# Patient Record
Sex: Female | Born: 1975 | Race: White | Hispanic: No | Marital: Married | Smoking: Former smoker
Health system: Southern US, Community
[De-identification: ages and names within clinical notes are randomized; demographics above are authoritative.]

## PROBLEM LIST (undated history)

## (undated) DIAGNOSIS — R51 Headache: Secondary | ICD-10-CM

## (undated) DIAGNOSIS — R519 Headache, unspecified: Secondary | ICD-10-CM

## (undated) DIAGNOSIS — M1990 Unspecified osteoarthritis, unspecified site: Secondary | ICD-10-CM

## (undated) DIAGNOSIS — J45909 Unspecified asthma, uncomplicated: Secondary | ICD-10-CM

## (undated) HISTORY — DX: Headache: R51

## (undated) HISTORY — DX: Headache, unspecified: R51.9

## (undated) HISTORY — PX: OTHER SURGICAL HISTORY: SHX169

## (undated) HISTORY — DX: Unspecified osteoarthritis, unspecified site: M19.90

## (undated) HISTORY — PX: ABLATION: SHX5711

## (undated) HISTORY — DX: Unspecified asthma, uncomplicated: J45.909

---

## 2009-02-20 ENCOUNTER — Ambulatory Visit: Payer: Self-pay | Admitting: Cardiology

## 2012-11-23 DIAGNOSIS — M519 Unspecified thoracic, thoracolumbar and lumbosacral intervertebral disc disorder: Secondary | ICD-10-CM | POA: Insufficient documentation

## 2012-11-23 DIAGNOSIS — M4120 Other idiopathic scoliosis, site unspecified: Secondary | ICD-10-CM | POA: Insufficient documentation

## 2013-01-25 DIAGNOSIS — M461 Sacroiliitis, not elsewhere classified: Secondary | ICD-10-CM | POA: Insufficient documentation

## 2013-02-27 DIAGNOSIS — M545 Low back pain, unspecified: Secondary | ICD-10-CM | POA: Insufficient documentation

## 2014-05-21 ENCOUNTER — Encounter: Payer: Self-pay | Admitting: Neurology

## 2014-05-21 ENCOUNTER — Ambulatory Visit (INDEPENDENT_AMBULATORY_CARE_PROVIDER_SITE_OTHER): Payer: 59 | Admitting: Neurology

## 2014-05-21 VITALS — BP 135/84 | HR 74 | Ht 69.0 in | Wt 342.0 lb

## 2014-05-21 DIAGNOSIS — G4452 New daily persistent headache (NDPH): Secondary | ICD-10-CM | POA: Insufficient documentation

## 2014-05-21 DIAGNOSIS — G932 Benign intracranial hypertension: Secondary | ICD-10-CM

## 2014-05-21 MED ORDER — TOPIRAMATE ER 200 MG PO CAP24: 200.0000 mg | ORAL_CAPSULE | Freq: Every day | ORAL | Status: DC

## 2014-05-21 NOTE — Progress Notes (Signed)
[facility]    Provider:  Dr Lucia GaskinsAhern Referring Provider: Benito MccreedyWilson, Ewain P., MD Primary Care Physician:  Selinda FlavinHOWARD, KEVIN, MD  CC:  Worsening severe headaches  HPI:  Sheri Carrillo is a 39 y.o. female here as a referral from Dr. Andrey CampanileWilson for headaches. 6 months ago the headaches worsened. She feels a lot of pressure in her face. Shooting pains all around the head and in the temple. She sees flashes of light. She was evaluated by ENT and ophthalmology who are worried about IIH. Headaches are daily, some days are worse than others. She has watery eyes and runny nose occ. Feels like a lightning bolt in one area, It happens in the temples and in the forehead. Pressure is in the face and ears, feels exactly like a sinus infection but wkup and CT of the sinuses was negatibe. The headaches are daily, 10/10 pain and can last all day. She has light sensitivity and when it gets bad wants to go and lay down and be still in a dark room. She is miserable, crying, so painful. Eyesight gets blurry, feels like she is straining more than usual. She hears swishing and ringing in the ears. She has gained 60 pounds since 2010. Lots of stress recently as well. No fever or stiff neck, no other focal neurologic deficits. She is here with her husband who also provides information.    Review of Systems: Patient complains of symptoms per HPI as well as the following symptoms: fatigue, blurred vision, eye pain, ringing in ears, spinning sensation, headache, dizziness, runny nose. Pertinent negatives per HPI. All others negative.   History   Social History  . Marital Status: Married    Spouse Name: N/A    Number of Children: 2  . Years of Education: 12    Occupational History  . [employer]  Other   Social History Main Topics  . Smoking status: Former Smoker    Quit date: 04/14/1995  . Smokeless tobacco: Not on file  . Alcohol Use: 0.0 oz/week    0 Not specified per week     Comment:  2-3 per month   . Drug Use: No  . Sexual Activity: Not on file   Other Topics Concern  . Not on file   Social History Narrative   Lives at home with husband.    Has 2 children.    Caffeine use: 2-3 cups per day     Family History  Problem Relation Age of Onset  . Diabetes Mother   . Lung cancer Mother   . Lung cancer Father     History reviewed. No pertinent past medical history.  History reviewed. No pertinent past surgical history.  Current Outpatient Prescriptions  Medication Sig Dispense Refill  . ALPRAZolam (XANAX) 0.5 MG tablet Take 0.5 mg by mouth at bedtime as needed for anxiety or sleep (once a month).    . Ascorbic Acid (VITAMIN C PO) Take 1 tablet by mouth daily.    . Calcium-Magnesium-Zinc (CAL-MAG-ZINC PO) Take 1 tablet by mouth daily.    . cholecalciferol (VITAMIN D) 1000 UNITS tablet Take 2,000 Units by mouth daily.    . cyclobenzaprine (FLEXERIL) 10 MG tablet Take 10 mg by mouth once.    Marland Kitchen. ibuprofen (ADVIL,MOTRIN) 800 MG tablet Take 800 mg by mouth every 8 (eight) hours as needed (once a day).    . Multiple Vitamin (MULTIVITAMIN) capsule Take 1 capsule by mouth daily.    . Topiramate ER 200 MG  CP24 Take 200 mg by mouth at bedtime. 30 capsule 6   No current facility-administered medications for this visit.    Allergies as of 05/21/2014  . (No Known Allergies)    Vitals: BP 135/84 mmHg  Pulse 74  Ht  (1.753 m)  Wt 342 lb (155.13 kg)  BMI 50.48 kg/m2 Last Weight:  Wt Readings from Last 1 Encounters:  05/21/14 342 lb (155.13 kg)   Last Height:   Ht Readings from Last 1 Encounters:  05/21/14  (1.753 m)   Physical exam: Exam: Gen: NAD, conversant, well nourised, obese, well groomed                     CV: RRR, no MRG. No Carotid Bruits. No peripheral edema, warm, nontender Eyes: Conjunctivae clear without exudates or hemorrhage  Neuro: Detailed Neurologic Exam  Speech:    Speech is normal; fluent and spontaneous with normal  comprehension.  Cognition:    The patient is oriented to person, place, and time;     recent and remote memory intact;     language fluent;     normal attention, concentration,     fund of knowledge Cranial Nerves:    The pupils are equal, round, and reactive to light. Bilat 20/30 VA. The fundi are normal and spontaneous venous pulsations are present. Visual fields are full to finger confrontation. Extraocular movements are intact. Trigeminal sensation is intact and the muscles of mastication are normal. The face is symmetric. The palate elevates in the midline. Hearing intact. Voice is normal. Shoulder shrug is normal. The tongue has normal motion without fasciculations.   Coordination:    Normal finger to nose and heel to shin.  Gait:    Heel-toe and tandem gait are normal.   Motor Observation:    No asymmetry, no atrophy, and no involuntary movements noted. Tone:    Normal muscle tone.    Posture:    Posture is normal. normal erect    Strength:    Strength is V/V in the upper and lower limbs.      Sensation: intact to LT     Reflex Exam:  DTR's:    Deep tendon reflexes in the upper and lower extremities are normal bilaterally.   Toes:    The toes are downgoing bilaterally.   Clonus:    Clonus is absent.       Assessment/Plan:  Lovely 39 year old female who is morbidly obese with worsening daily headaches, vision changes, muffled hearing, weight gain. Neuro exam is normal. Will order an MRI of the brain. Will start Trokendi. Will defer LP right now until MRI results, VF testing was normal and there is no papilledema seen (she also saw ophthalmology).   Asked patient to call in about 10 days and let me know how she is doing Denied symptoms of OSA however she may have some obesity hypoventilation affecting her headaches, she declines wkup at this time MRI of the brain  Will start Trokendi Will order CMP.    (Trokendi Samples:  [id number] 07/2016  [id number] rr  07/2014)   Naomie Dean, MD  [facility] [address] [place], [place] [address]  Phone [phone] Fax [phone]

## 2014-05-21 NOTE — Patient Instructions (Signed)
Overall you are doing fairly well but I do want to suggest a few things today:   Remember to drink plenty of fluid, eat healthy meals and do not skip any meals. Try to eat protein with a every meal and eat a healthy snack such as fruit or nuts in between meals. Try to keep a regular sleep-wake schedule and try to exercise daily, particularly in the form of walking, 20-30 minutes a day, if you can.   As far as your medications are concerned, I would like to suggest:  Week one: Trokendi 50mg  at night (2 tabs) Week two: Trokendi 100mg  at night (4 tabs) Week three : Trokendi 200mg  tab at night  As far as diagnostic testing: MRi of the brain, labs  I would like to see you back in 3 months, sooner if we need to. Please call us with any interim questions, concerns, problems, updates or refill requests.   Please also call us for any test results so we can go over those with you on the phone.  My clinical assistant and will answer any of your questions and relay your messages to me and also relay most of my messages to you.   Our phone number is [phone]. We also have an after hours call service for urgent matters and there is a physician on-call for urgent questions. For any emergencies you know to call 911 or go to the nearest emergency room

## 2014-05-22 LAB — COMPREHENSIVE METABOLIC PANEL
ALBUMIN: 4.1 g/dL (ref 3.5–5.5)
ALK PHOS: 71 IU/L (ref 39–117)
ALT: 28 IU/L (ref 0–32)
AST: 20 IU/L (ref 0–40)
Albumin/Globulin Ratio: 1.8 (ref 1.1–2.5)
BUN / CREAT RATIO: 26 — AB (ref 9–23)
BUN: 19 mg/dL (ref 6–24)
Bilirubin Total: 0.3 mg/dL (ref 0.0–1.2)
CHLORIDE: 105 mmol/L (ref 97–108)
CO2: 21 mmol/L (ref 18–29)
Calcium: 9.5 mg/dL (ref 8.7–10.2)
Creatinine, Ser: 0.74 mg/dL (ref 0.57–1.00)
GFR calc Af Amer: 117 mL/min/{1.73_m2} (ref >59)
GFR calc non Af Amer: 102 mL/min/{1.73_m2} (ref >59)
GLUCOSE: 101 mg/dL — AB (ref 65–99)
Globulin, Total: 2.3 g/dL (ref 1.5–4.5)
POTASSIUM: 4.4 mmol/L (ref 3.5–5.2)
Sodium: 141 mmol/L (ref 134–144)
Total Protein: 6.4 g/dL (ref 6.0–8.5)

## 2014-05-24 ENCOUNTER — Telehealth: Payer: Self-pay | Admitting: *Deleted

## 2014-05-24 NOTE — Telephone Encounter (Signed)
Unable to contact patient because the number has been disconnected.

## 2014-05-24 NOTE — Telephone Encounter (Signed)
Cannot get a hold of the patient. Both numbers state "We are sorry this number has been disconnected".

## 2014-05-29 ENCOUNTER — Telehealth: Payer: Self-pay | Admitting: *Deleted

## 2014-05-29 ENCOUNTER — Telehealth: Payer: Self-pay

## 2014-05-29 NOTE — Telephone Encounter (Signed)
Talked with patient about normal lab results. [facility].

## 2014-05-29 NOTE — Telephone Encounter (Signed)
Talked with patient's husband and got correct mobile number. Will try calling the patient's wife on correct mobile number.

## 2014-05-29 NOTE — Telephone Encounter (Signed)
Tried calling patient again. Said "I'm sorry this number has been disconnected, please check the number and try again".

## 2014-05-29 NOTE — Telephone Encounter (Signed)
Left a voicemail for the patient to call [facility].  She just needs to call [phone] option 1. Carollee [facility]terShannon  already sent her orders over.

## 2014-06-01 DIAGNOSIS — G4452 New daily persistent headache (NDPH): Secondary | ICD-10-CM

## 2014-06-04 ENCOUNTER — Other Ambulatory Visit: Payer: Self-pay | Admitting: Neurology

## 2014-06-04 ENCOUNTER — Telehealth: Payer: Self-pay | Admitting: Neurology

## 2014-06-04 DIAGNOSIS — G932 Benign intracranial hypertension: Secondary | ICD-10-CM

## 2014-06-04 MED ORDER — TOPIRAMATE ER 100 MG PO CAP24: 100.0000 mg | ORAL_CAPSULE | Freq: Every day | ORAL | Status: DC

## 2014-06-04 NOTE — Telephone Encounter (Signed)
Spoke to patient, will stay on Trokendi 100mg  qhs for now.   Kara Meadmma - she had her MRi done at [facility]. They should have sent us the disk and possibly the report. Can you go to medical records and see if they have it? If not, we need to call [facility]. Give me an update on Tuesday so I can call this patient back. Thank you.

## 2014-06-04 NOTE — Telephone Encounter (Signed)
Patient is calling about her MRI results.  Please call

## 2014-06-04 NOTE — Telephone Encounter (Signed)
Spoke to patient, will stay on Trokendi 100mg qhs for now.   Emma - she had her MRi done at [facility]. They should have sent us the disk and possibly the report. Can you go to medical records and see if they have it? If not, we need to call [facility]. Give me an update on Tuesday so I can call this patient back. Thank you.  

## 2014-06-05 NOTE — Telephone Encounter (Signed)
I did call her and let he know the MRI was normal but possibly showed some acute sinusitis. It was a little late so I said I would call back in the morning but I did want her to go to bed tonight knowing the mri of her brain looked good. This does not rule out Increased Intracranial pressure but we can discuss that in the morning. Thanks, Sheralyn Boatmanoni

## 2014-06-07 ENCOUNTER — Other Ambulatory Visit: Payer: Self-pay | Admitting: Neurology

## 2014-06-07 ENCOUNTER — Telehealth: Payer: Self-pay | Admitting: Neurology

## 2014-06-07 ENCOUNTER — Telehealth: Payer: Self-pay | Admitting: *Deleted

## 2014-06-07 DIAGNOSIS — G932 Benign intracranial hypertension: Secondary | ICD-10-CM

## 2014-06-07 MED ORDER — TOPIRAMATE 25 MG PO TABS: 25.0000 mg | ORAL_TABLET | Freq: Two times a day (BID) | ORAL | Status: DC

## 2014-06-07 MED ORDER — AMOXICILLIN-POT CLAVULANATE 875-125 MG PO TABS: 1.0000 | ORAL_TABLET | Freq: Two times a day (BID) | ORAL | Status: DC

## 2014-06-07 NOTE — Telephone Encounter (Signed)
Left [facility] message for [facility] to see if the patient could get a LP completed either tomorrow or Monday. Gave [facility] phone and office hours.

## 2014-06-07 NOTE — Telephone Encounter (Signed)
Pt is calling stating she wanted a call back regarding Topiramate ER 100 MG CP24. She states that the insurance will not cover.  She is completely out.  Please call and advise.

## 2014-06-07 NOTE — Telephone Encounter (Signed)
Patient's ins will not cover Trokendi XR (excluded from plan), but will cover regular Topiramate.  Would you like to change Rx?  Please advise.  Thank you.

## 2014-06-07 NOTE — Telephone Encounter (Signed)
Sheri Carrillo, patient needs an LP. Think we can leave Duwayne HeckDanielle a message? I am placing the order. Not sure it can happen tomorrow or Monday. thanks

## 2014-06-08 ENCOUNTER — Telehealth: Payer: Self-pay | Admitting: *Deleted

## 2014-06-08 NOTE — Telephone Encounter (Signed)
Talked with Duwayne [facility]le [facility] and she said she already talked with the patient and scheduled an appointment for the LP on 06/11/14 at 10:00am.

## 2014-06-11 ENCOUNTER — Inpatient Hospital Stay
Admission: RE | Admit: 2014-06-11 | Discharge: 2014-06-11 | Disposition: A | Discharge status: Home / Self Care | Payer: Self-pay | Source: Ambulatory Visit | Attending: Neurology | Admitting: Neurology

## 2014-06-11 NOTE — Discharge Instructions (Signed)

## 2014-06-13 NOTE — Telephone Encounter (Signed)
Called patient to see how her headache was and if she had scheduled the LP with opening pressure. Left message on voice mail to cal us back if needed.   Sheri Carrillo - will you please follow up with Duwayne Heckanielle at [facility] to see what happened with the lumbar puncture, if she was able to reach patient and schedule? Thank you

## 2014-06-14 ENCOUNTER — Telehealth: Payer: Self-pay | Admitting: *Deleted

## 2014-06-14 NOTE — Telephone Encounter (Signed)
Talked with patient to see if she had her lumbar puncture on 06/11/14. Patient stated " I chickened out and never went and I was going to call Dr. Lucia GaskinsAhern about it". She said she would like to talk with Dr. Lucia GaskinsAhern about it.

## 2014-06-14 NOTE — Telephone Encounter (Signed)
I didn't see any results. Can you see if she had it done?

## 2014-06-17 ENCOUNTER — Other Ambulatory Visit: Payer: Self-pay | Admitting: Neurology

## 2014-06-17 MED ORDER — TOPIRAMATE 25 MG PO TABS: ORAL_TABLET | ORAL | Status: DC

## 2014-06-17 NOTE — Telephone Encounter (Signed)
Spoke to patient. Her headaches are improving. Will increase Topamax to 50mg  in the evenings and 25mg  in the morning. If patient tolerates can try 50mg  bid. Otherwise can add lasix. She has a lot of personal issues currently but when they have subsided will arrange a sleep study and LP. In the meantime continue the topamax as drected.

## 2015-12-03 DIAGNOSIS — R5381 Other malaise: Secondary | ICD-10-CM | POA: Insufficient documentation

## 2016-09-15 DIAGNOSIS — M25569 Pain in unspecified knee: Secondary | ICD-10-CM | POA: Insufficient documentation

## 2016-09-15 DIAGNOSIS — IMO0002 Reserved for concepts with insufficient information to code with codable children: Secondary | ICD-10-CM | POA: Insufficient documentation

## 2016-12-25 ENCOUNTER — Encounter: Payer: Self-pay | Admitting: *Deleted

## 2016-12-28 ENCOUNTER — Ambulatory Visit (INDEPENDENT_AMBULATORY_CARE_PROVIDER_SITE_OTHER): Payer: Self-pay | Admitting: Cardiovascular Disease

## 2016-12-28 ENCOUNTER — Encounter: Payer: Self-pay | Admitting: Cardiovascular Disease

## 2016-12-28 VITALS — BP 118/75 | HR 67 | Ht 69.0 in | Wt 331.0 lb

## 2016-12-28 DIAGNOSIS — R0609 Other forms of dyspnea: Secondary | ICD-10-CM

## 2016-12-28 DIAGNOSIS — R002 Palpitations: Secondary | ICD-10-CM

## 2016-12-28 DIAGNOSIS — R0789 Other chest pain: Secondary | ICD-10-CM

## 2016-12-28 DIAGNOSIS — Z9289 Personal history of other medical treatment: Secondary | ICD-10-CM

## 2016-12-28 NOTE — Patient Instructions (Signed)
Medication Instructions:  Continue all current medications.  Labwork: none  Testing/Procedures: none  Follow-Up: 3 months   Any Other Special Instructions Will Be Listed Below (If Applicable).  If you need a refill on your cardiac medications before your next appointment, please call your pharmacy.  

## 2016-12-28 NOTE — Progress Notes (Signed)
CARDIOLOGY CONSULT NOTE  Patient ID: Sheri Carrillo MRN: [id number] DOB/AGE: 04/20/1975 41 y.o.  Admit date: (Not on file) Primary Physician: Selinda Flavin, MD Referring Physician: Ila Mcgill PA  Reason for Consultation: Chest tightness and exertional dyspnea  HPI: Sheri Carrillo is a 41 y.o. female who is being seen today for the evaluation of chest tightness and exertional dyspnea at the request of Allwardt, Alyssa, PA.   She was evaluated in the ED for shortness of breath on 11/23/16 at [facility] I personally reviewed all relevant documentation, labs, and studies. She has a history of tobacco abuse and both parents passed way of lung cancer.  In the ED she was hypertensive, blood pressure 154/89. Oxygen saturations were normal. X-ray showed no acute cardiopulmonary abnormality. This was a portable film.  Labs: Sodium 136, potassium 3.8, BUN 17, creatinine 0.79, calcium 9.1, magnesium 2.1, normal liver transaminases, normal troponin, normal N-terminal proBNP of 62, normal d-dimer, white blood cells 8.9, hemoglobin 13.2, platelets 381, normal urinalysis.  She underwent CT angiography of the chest which showed no evidence of pulmonary embolus. The visualized great vessels appear unremarkable. There was no adenopathy and no thoracic aortic lesion.  ECG which are percent interpreted demonstrated normal sinus rhythm with no ischemic ST segment or T-wave abnormalities, nor any arrhythmias.  She told me symptoms initially began during a beach trip with her husband. She was developing exertional dyspnea and then went to an urgent care. She received a breathing treatment and then felt better.  She also describes palpitations after starting Topamax. She is still on Topamax but started Celexa and now feels better. She does have a long history of palpitations.  Over the past 2 months, she has had some exertional dyspnea and upper sided chest tightness on  the right chest and left infra-axillary region. This has occurred on at least one or two occasions.  When showering one day, she felt fatigued and almost felt like she was going to pass out.  She said she is very sedentary and sits all day in front of a computer.   She and her husband used to eat out 90% of the time but she recently changed her diet in the past few weeks and now has felt more energetic.  She has been walking a trail in [place] with her daughter which is 3 miles long. The first time she was able to walk 3 miles without difficulty. The second time she walked one and a half miles and had some shortness of breath when walking uphill and had to stop.  She tells me that her lipids and blood glucose levels are routinely normal. She did have vitamin D and vitamin B-12 deficiency for a time.  She denies orthopnea and paroxysmal nocturnal dyspnea. She used to have bilateral feet and ankle swelling but since changing her diet, this now only occurs during the time of menses.   No Known Allergies  Current Outpatient Prescriptions  Medication Sig Dispense Refill  . ALPRAZolam (XANAX) 0.5 MG tablet Take 0.5 mg by mouth at bedtime as needed for anxiety or sleep.     . Calcium-Magnesium-Zinc (CAL-MAG-ZINC PO) Take 1 tablet by mouth daily.    . citalopram (CELEXA) 10 MG tablet Take 10 mg by mouth daily.    Marland Kitchen ibuprofen (ADVIL,MOTRIN) 800 MG tablet Take 800 mg by mouth every 8 (eight) hours as needed.     . topiramate (TOPAMAX) 100 MG tablet Take 100 mg by mouth  2 (two) times daily.     No current facility-administered medications for this visit.     Past Medical History:  Diagnosis Date  . Headache     Past Surgical History:  Procedure Laterality Date  . no surgical history      Social History   Social History  . Marital status: Married    Spouse name: N/A  . Number of children: 2  . Years of education: 12    Occupational History  . [employer]  Other   Social History  Main Topics  . Smoking status: Former Smoker    Quit date: 04/14/1995  . Smokeless tobacco: Never Used  . Alcohol use 0.0 oz/week     Comment: 2-3 per month   . Drug use: No  . Sexual activity: Not on file   Other Topics Concern  . Not on file   Social History Narrative   Lives at home with husband.    Has 2 children.    Caffeine use: 2-3 cups per day      No family history of premature CAD in 1st degree relatives.  Current Meds  Medication Sig  . ALPRAZolam (XANAX) 0.5 MG tablet Take 0.5 mg by mouth at bedtime as needed for anxiety or sleep.   . Calcium-Magnesium-Zinc (CAL-MAG-ZINC PO) Take 1 tablet by mouth daily.  . citalopram (CELEXA) 10 MG tablet Take 10 mg by mouth daily.  Marland Kitchen ibuprofen (ADVIL,MOTRIN) 800 MG tablet Take 800 mg by mouth every 8 (eight) hours as needed.   . topiramate (TOPAMAX) 100 MG tablet Take 100 mg by mouth 2 (two) times daily.      Review of systems complete and found to be negative unless listed above in HPI    Physical exam Blood pressure 117/79, pulse 72, height  (1.753 m), weight (!) 331 lb (150.1 kg), SpO2 97 %. General: NAD Neck: No JVD, no thyromegaly or thyroid nodule.  Lungs: Clear to auscultation bilaterally with normal respiratory effort. CV: Nondisplaced PMI. Regular rate and rhythm, normal S1/S2, no S3/S4, no murmur.  No peripheral edema.  No carotid bruit.    Abdomen: Soft, nontender, no distention.  Skin: Intact without lesions or rashes.  Neurologic: Alert and oriented x 3.  Psych: Normal affect. Extremities: No clubbing or cyanosis.  HEENT: Normal.   ECG: Most recent ECG reviewed.   Labs: Lab Results  Component Value Date/Time   K 4.4 05/21/2014 03:16 PM   BUN 19 05/21/2014 03:16 PM   CREATININE 0.74 05/21/2014 03:16 PM   ALT 28 05/21/2014 03:16 PM     Lipids: No results found for: LDLCALC, LDLDIRECT, CHOL, TRIG, HDL      ASSESSMENT AND PLAN:  1. Chest pain and exertional dyspnea: She has a low pretest  probability of coronary artery disease. ECG was normal as were troponins and BNP. "Heart enlargement "by portable chest x-rays is nonspecific and often occurs due to radiographic splaying. No coronary artery calcifications were seen with chest CT. I recommend continued exercise and dietary modification with weight loss. She may have some restrictive lung disease due to morbid obesity. This is reversible with weight loss.  2. Palpitations: These occur less frequently since being on Celexa. They may have initially been triggered or exacerbated by Topamax. She does not require Holter or event monitoring at this time.     Disposition: Follow up in 3 months   Signed: Prentice Docker, M.D., F.A.C.C.  12/28/2016, 9:27 AM

## 2017-03-30 ENCOUNTER — Ambulatory Visit: Payer: Self-pay | Admitting: Cardiovascular Disease

## 2017-03-30 ENCOUNTER — Encounter: Payer: Self-pay | Admitting: Cardiovascular Disease

## 2017-03-31 ENCOUNTER — Encounter: Payer: Self-pay | Admitting: Cardiovascular Disease

## 2017-09-21 ENCOUNTER — Other Ambulatory Visit (HOSPITAL_COMMUNITY)
Admission: RE | Admit: 2017-09-21 | Discharge: 2017-09-21 | Disposition: A | Discharge status: Home / Self Care | Payer: Self-pay | Source: Ambulatory Visit | Attending: Nurse Practitioner | Admitting: Nurse Practitioner

## 2017-09-21 DIAGNOSIS — N841 Polyp of cervix uteri: Secondary | ICD-10-CM | POA: Insufficient documentation

## 2020-07-25 ENCOUNTER — Other Ambulatory Visit: Payer: Self-pay | Admitting: Orthopedic Surgery

## 2020-07-25 DIAGNOSIS — M2341 Loose body in knee, right knee: Secondary | ICD-10-CM

## 2020-07-25 DIAGNOSIS — M25561 Pain in right knee: Secondary | ICD-10-CM

## 2020-07-25 DIAGNOSIS — M23204 Derangement of unspecified medial meniscus due to old tear or injury, left knee: Secondary | ICD-10-CM

## 2020-07-25 DIAGNOSIS — M25562 Pain in left knee: Secondary | ICD-10-CM

## 2021-01-12 ENCOUNTER — Ambulatory Visit
Admission: RE | Admit: 2021-01-12 | Discharge: 2021-01-12 | Disposition: A | Discharge status: Home / Self Care | Payer: BC Managed Care – PPO | Source: Ambulatory Visit | Attending: Orthopedic Surgery | Admitting: Orthopedic Surgery

## 2021-01-12 ENCOUNTER — Other Ambulatory Visit: Payer: Self-pay

## 2021-01-12 DIAGNOSIS — M25562 Pain in left knee: Secondary | ICD-10-CM

## 2021-01-12 DIAGNOSIS — M2341 Loose body in knee, right knee: Secondary | ICD-10-CM

## 2021-01-12 DIAGNOSIS — M23204 Derangement of unspecified medial meniscus due to old tear or injury, left knee: Secondary | ICD-10-CM

## 2021-01-12 DIAGNOSIS — M25561 Pain in right knee: Secondary | ICD-10-CM

## 2021-01-12 IMAGING — MR MR KNEE*R* W/O CM
6 series · 40 of 40 positions shown · non-contrast
Comparison: None.

CLINICAL DATA: Right knee pain

EXAM:
MRI OF THE RIGHT KNEE WITHOUT CONTRAST
TECHNIQUE: Multiplanar, multisequence MR imaging of the knee was performed. No
intravenous contrast was administered.

[Series 4: T2 fat-sat · axial · right · 4.0mm · 0.62mm/px · z∈[-80,+54]mm · 6 of 32 slices shown (1 of 3)]
[im 1/32]
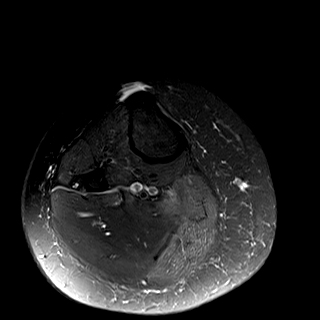
[im 7/32]
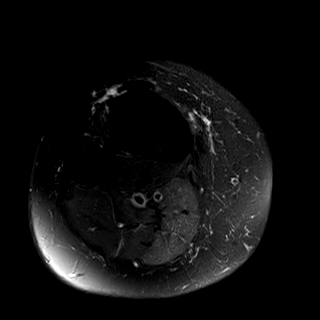
[im 13/32]
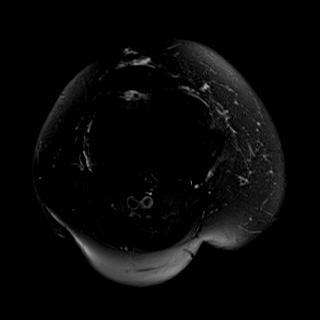
[im 19/32]
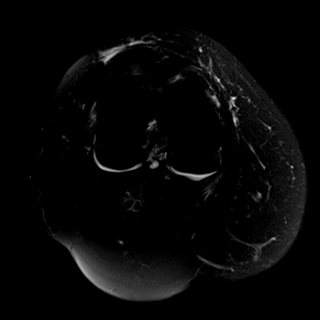
[im 25/32]
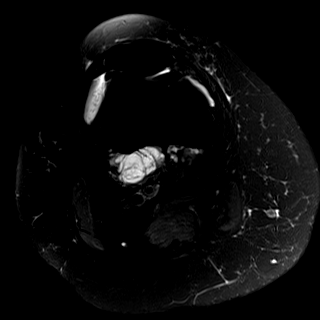
[im 32/32]
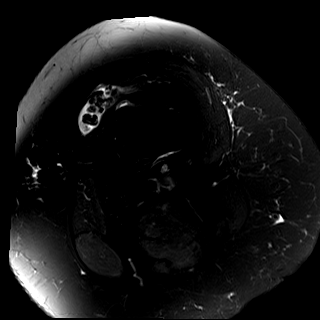

[Series 5: T2 fat-sat · coronal · right · 4.0mm · 0.62mm/px · 7 of 32 slices shown (2 of 3)]
[im 1/32]
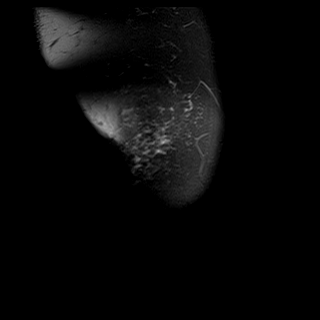
[im 6/32]
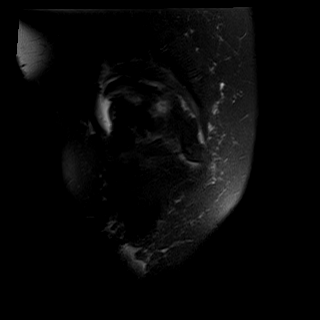
[im 11/32]
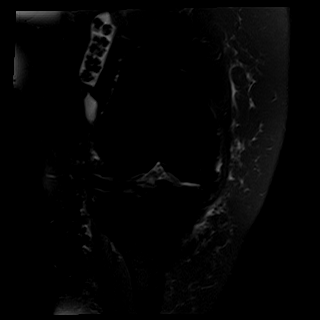
[im 16/32]
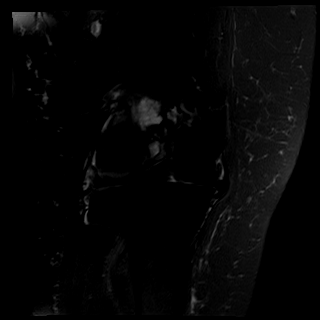
[im 21/32]
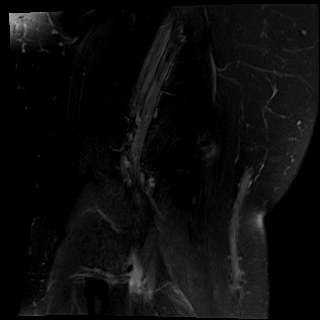
[im 26/32]
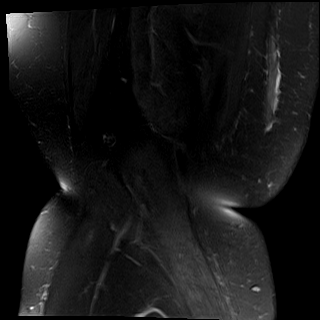
[im 32/32]
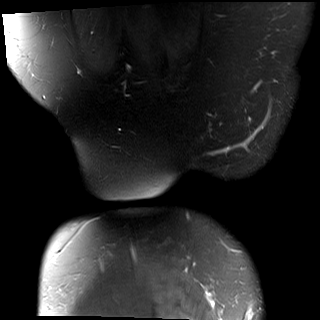

[Series 6: T1 · coronal · right · 4.0mm · 0.62mm/px · 7 of 32 slices shown]
[im 1/32]
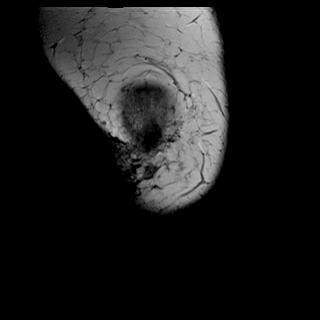
[im 6/32]
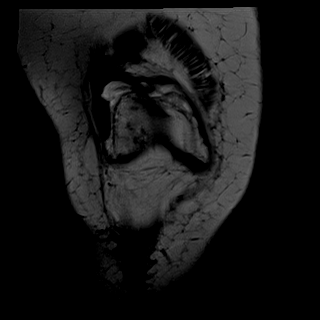
[im 11/32]
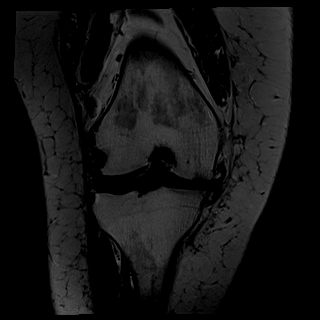
[im 16/32]
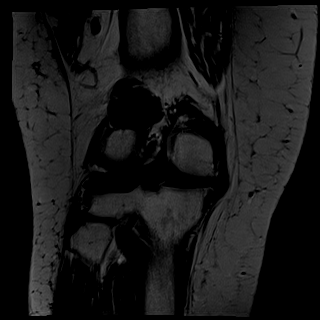
[im 21/32]
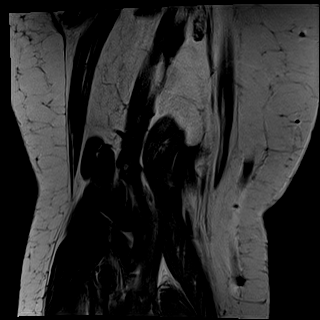
[im 26/32]
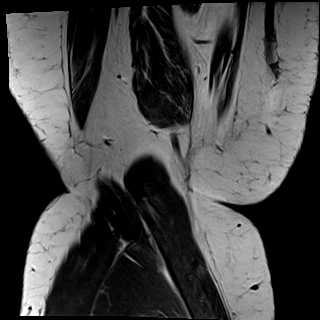
[im 32/32]
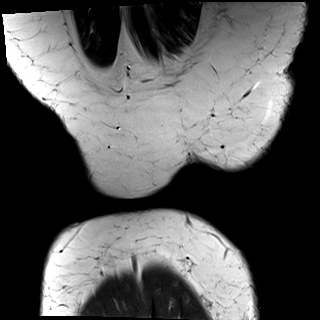

[Series 7: PD fat-sat · coronal · right · 3.0mm · 0.78mm/px · 8 of 37 slices shown (1 of 2)]
[im 1/37]
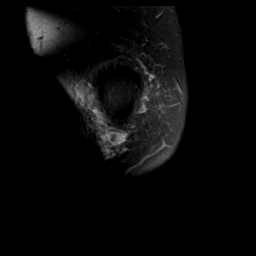
[im 6/37]
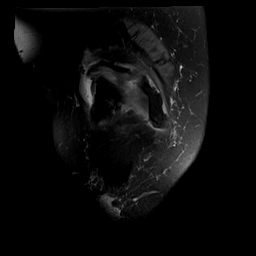
[im 11/37]
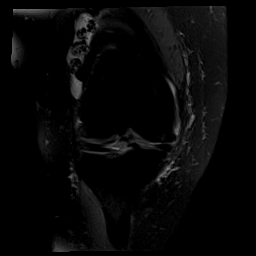
[im 16/37]
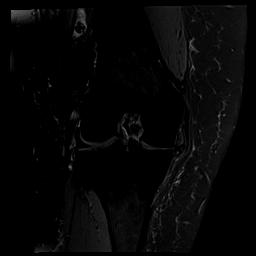
[im 21/37]
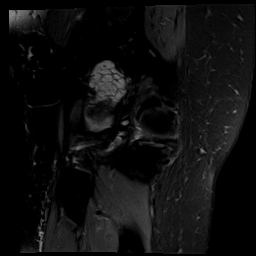
[im 26/37]
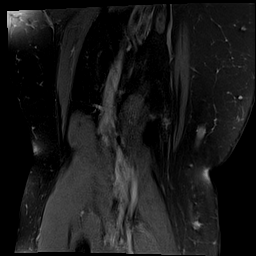
[im 31/37]
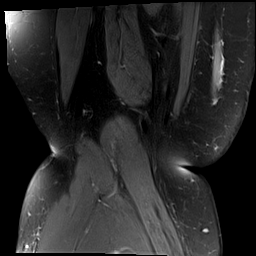
[im 37/37]
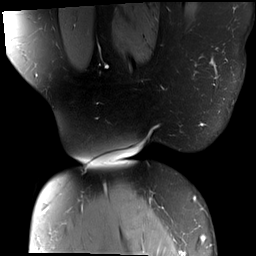

[Series 8: PD fat-sat · sagittal · right · 3.3mm · 0.62mm/px · 6 of 28 slices shown (2 of 2)]
[im 1/28]
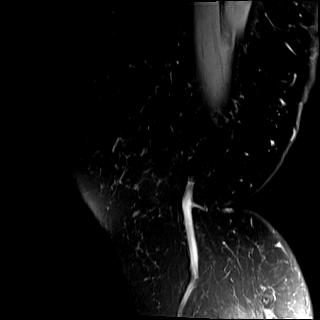
[im 6/28]
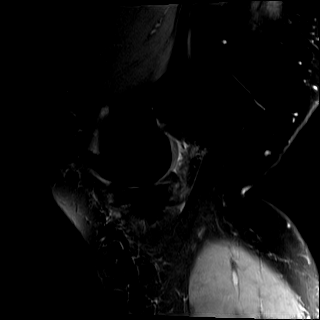
[im 11/28]
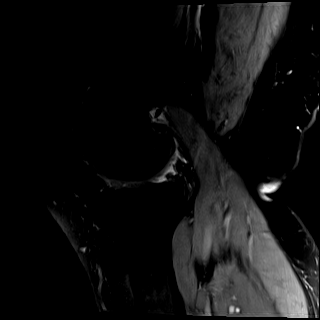
[im 17/28]
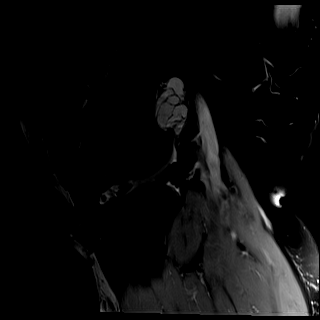
[im 22/28]
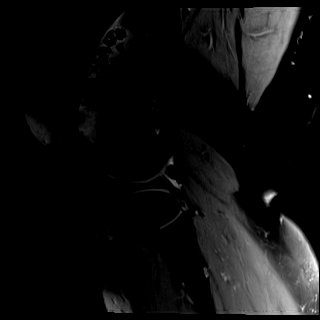
[im 28/28]
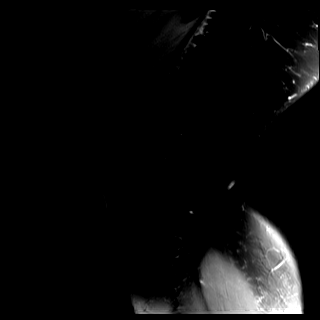

[Series 9: T2 fat-sat · sagittal · right · 3.3mm · 0.62mm/px · 6 of 28 slices shown (3 of 3)]
[im 1/28]
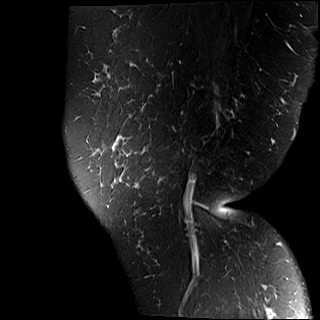
[im 6/28]
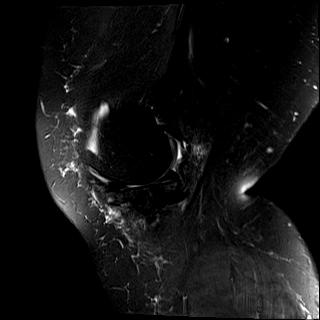
[im 11/28]
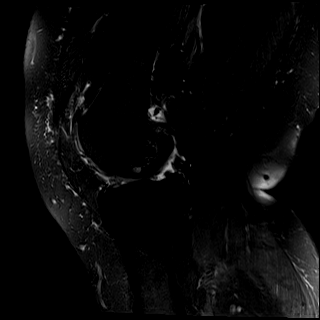
[im 17/28]
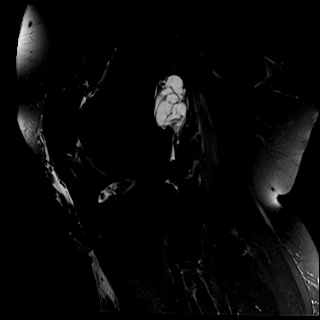
[im 22/28]
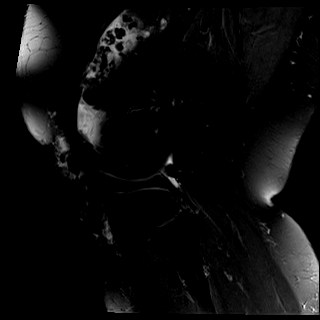
[im 28/28]
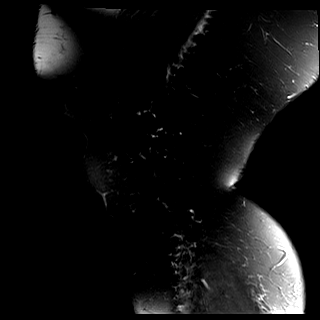

[40 of 40 positions shown; findings below may reference images not displayed]

FINDINGS: MENISCI

Medial: Degenerative tearing of the posterior horn and body of the
medial meniscus, with 6 mm medial extrusion and likely radial tear
in the posterior horn (coronal T2 image 15) and substance loss near
the meniscal root.

Lateral: Intrasubstance degenerative signal without definitive
lateral meniscus tear.

LIGAMENTS

Cruciates: ACL and PCL are intact.

Collaterals: Bowing of the MCL from extruded meniscus. The MCL is
intact. Lateral collateral ligament complex is intact.

CARTILAGE

Patellofemoral: Near full-thickness cartilage loss along the lateral
patellar facet and lateral trochlea with some subchondral bony
edema.

Medial: Severe chondrosis with near full-thickness cartilage loss
along the weight-bearing surfaces.

Lateral:  Mild chondrosis.

JOINT: Small joint effusion. There are multiple intra-articular
osteochondral joint bodies along the superolateral suprapatellar
joint space (axial T2 image 1).

POPLITEAL FOSSA: No Baker's cyst.

EXTENSOR MECHANISM: Intact quadriceps tendon. Intact patellar
tendon.

BONES: No aggressive osseous lesion. No fracture or dislocation.
Tricompartment osteophyte formation.

Other: Ganglion cyst formation along the posterosuperior lateral
joint capsule and lateral gastrocnemius tendon measuring 3.5 x
by 3.3 cm (axial T2 image 7, sagittal image 18). Smaller ganglion
cyst formation along the medial gastrocnemius insertion and along
the posterior aspect of the ACL and PCL.
IMPRESSION: Tricompartment osteoarthritis of the right knee, worst in the medial
and patellofemoral compartments, cartilaginous abnormalities as
described above. Multiple intra-articular osteochondral joint bodies
along the superolateral joint.

Degenerative tearing of the posterior horn and body of the medial
meniscus with 6 mm medial extrusion, with likely radial tear in the
posterior horn and substance loss near the meniscal root.

Prominent multifocal ganglion cyst formation as described above.

## 2021-01-12 IMAGING — MR MR KNEE*L* W/O CM
6 series · 40 of 40 positions shown · non-contrast
Comparison: None.

CLINICAL DATA: Left knee pain

EXAM:
MRI OF THE LEFT KNEE WITHOUT CONTRAST
TECHNIQUE: Multiplanar, multisequence MR imaging of the knee was performed. No
intravenous contrast was administered.

[Series 6: T2 fat-sat · axial · left · 4.0mm · 0.50mm/px · z∈[-136,+17]mm · 7 of 36 slices shown (1 of 3)]
[im 1/36]
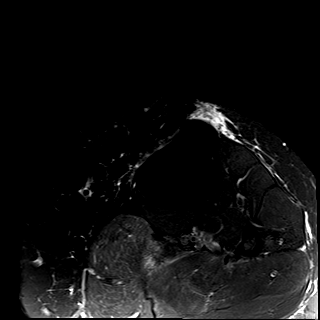
[im 6/36]
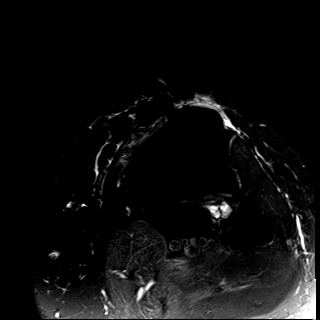
[im 12/36]
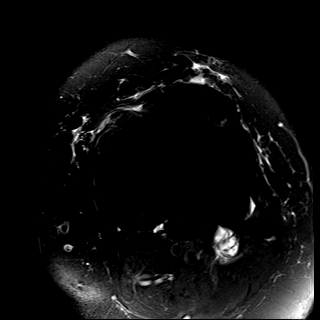
[im 18/36]
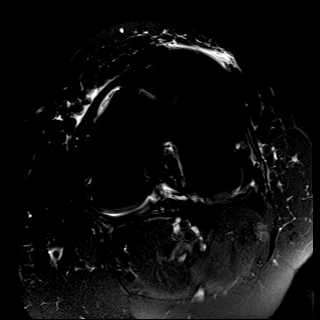
[im 24/36]
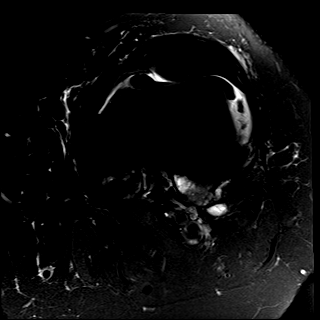
[im 30/36]
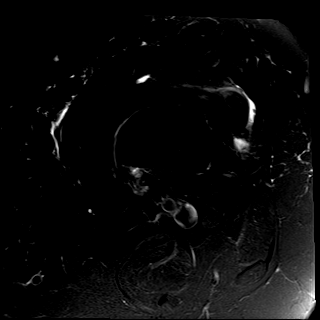
[im 36/36]
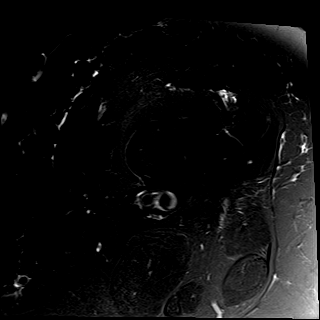

[Series 7: T2 fat-sat · coronal · left · 4.0mm · 0.50mm/px · 7 of 34 slices shown (2 of 3)]
[im 1/34]
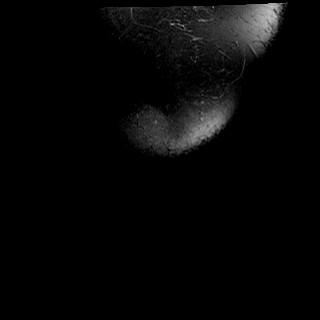
[im 6/34]
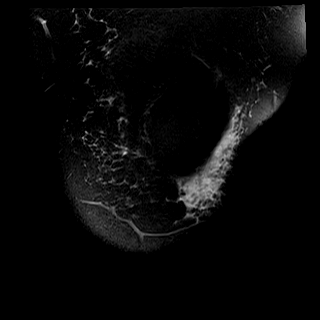
[im 12/34]
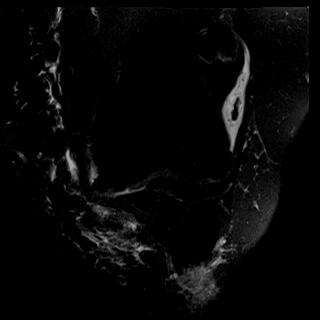
[im 17/34]
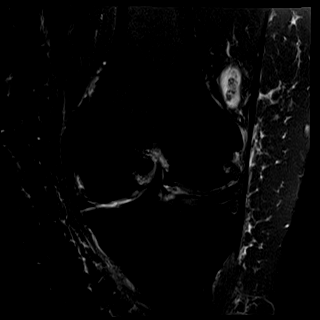
[im 23/34]
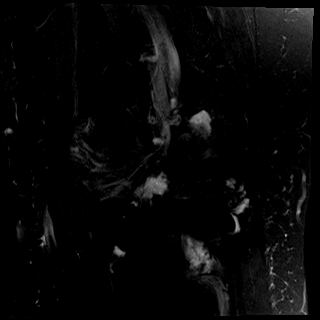
[im 28/34]
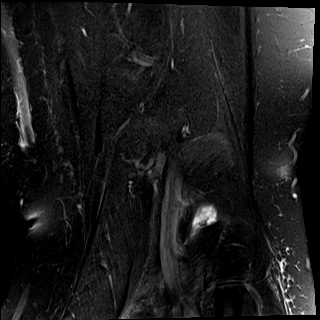
[im 34/34]
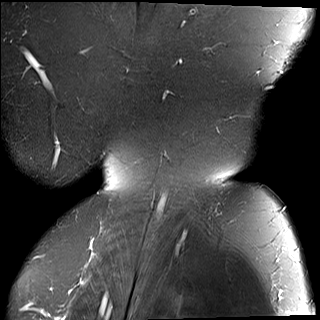

[Series 8: T1 · coronal · left · 4.0mm · 0.50mm/px · 7 of 34 slices shown]
[im 1/34]
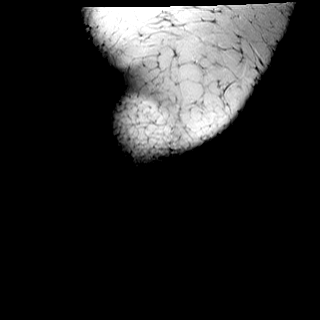
[im 6/34]
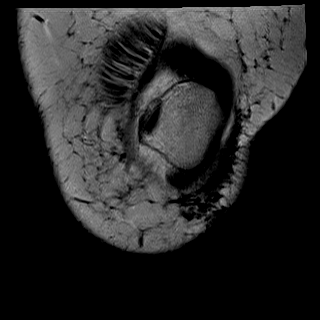
[im 12/34]
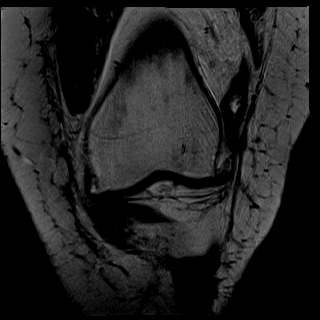
[im 17/34]
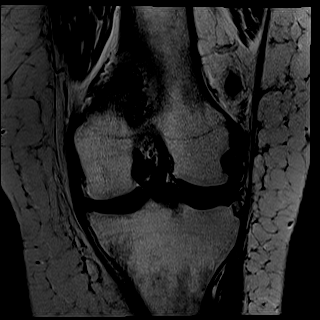
[im 23/34]
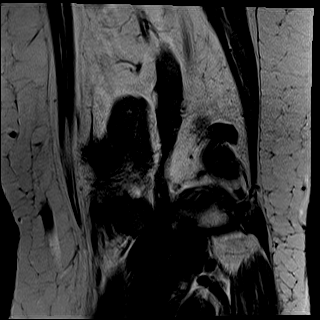
[im 28/34]
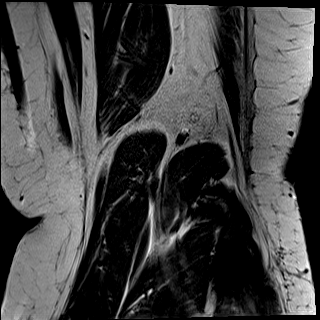
[im 34/34]
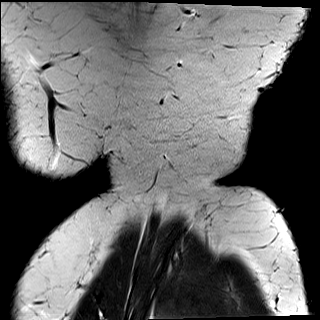

[Series 9: PD fat-sat · coronal · left · 3.3mm · 0.62mm/px · 7 of 36 slices shown (1 of 2)]
[im 1/36]
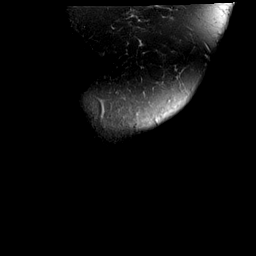
[im 6/36]
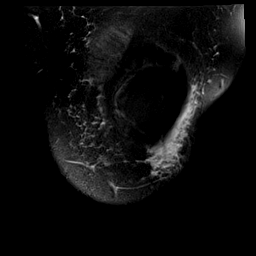
[im 12/36]
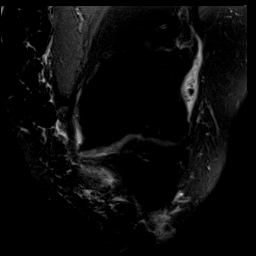
[im 18/36]
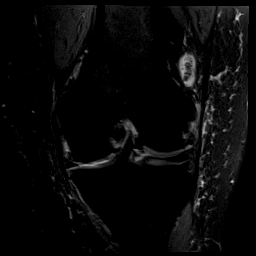
[im 24/36]
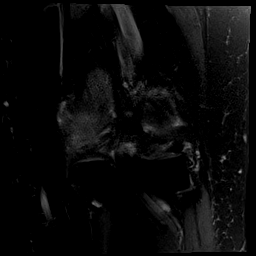
[im 30/36]
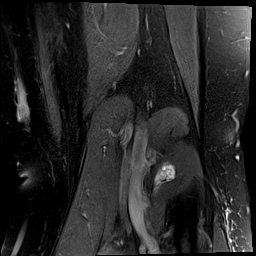
[im 36/36]
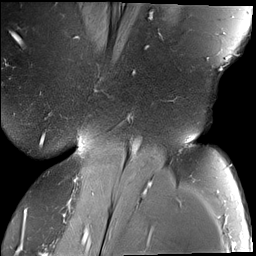

[Series 10: PD fat-sat · sagittal · left · 3.3mm · 0.50mm/px · 6 of 31 slices shown (2 of 2)]
[im 1/31]
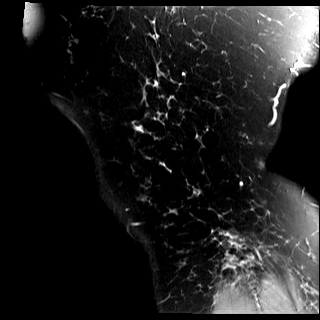
[im 7/31]
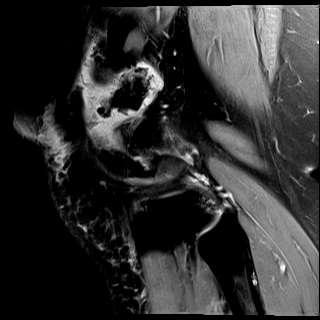
[im 13/31]
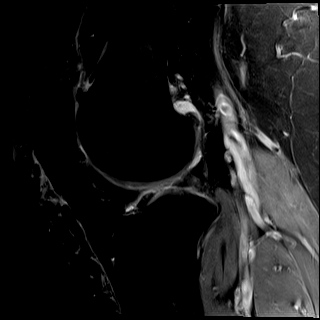
[im 19/31]
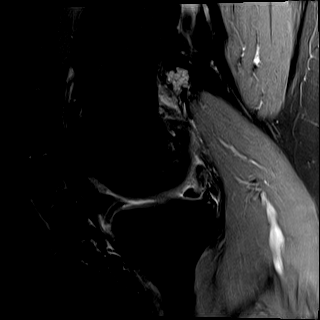
[im 25/31]
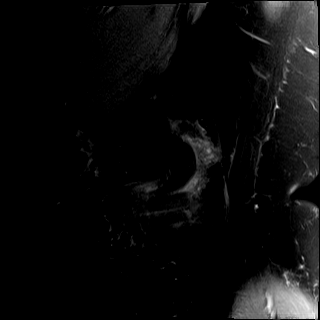
[im 31/31]
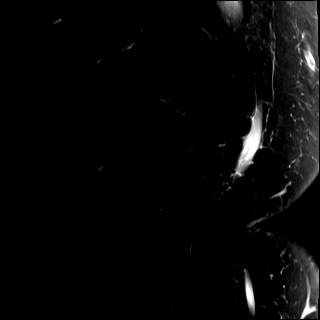

[Series 11: T2 fat-sat · sagittal · left · 3.3mm · 0.50mm/px · 6 of 31 slices shown (3 of 3)]
[im 1/31]
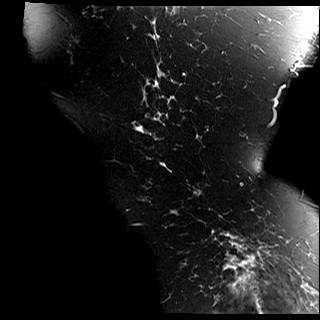
[im 7/31]
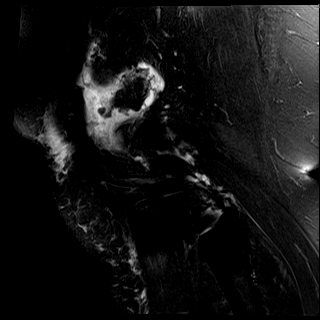
[im 13/31]
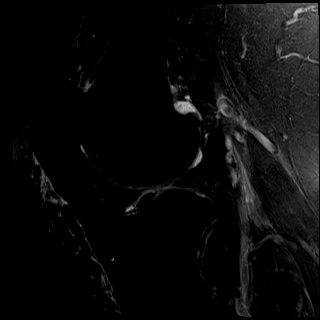
[im 19/31]
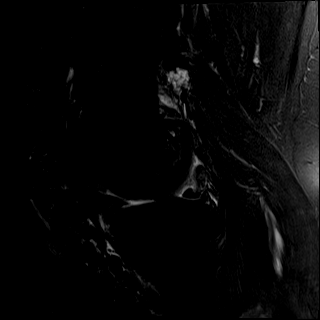
[im 25/31]
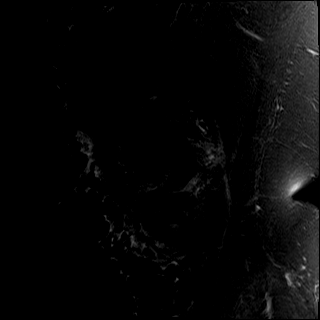
[im 31/31]
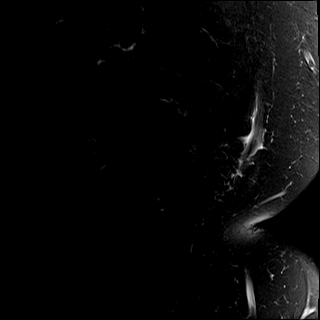

[40 of 40 positions shown; findings below may reference images not displayed]

FINDINGS: MENISCI

Medial: Extensive intrasubstance degenerative signal within the
medial meniscus without definitive tear.

Lateral: Extensive intrasubstance degenerative signal within the
lateral meniscus without definitive tear.

LIGAMENTS

Cruciates: ACL and PCL are intact.

Collaterals: Medial collateral ligament is intact. Lateral
collateral ligament complex is intact.

CARTILAGE

Patellofemoral: Near full-thickness cartilage loss along the lateral
patellar facet and lateral trochlea with some underlying bony edema.

Medial: Moderate chondrosis with areas of intermediate grade
partial-thickness chondral fissuring.

Lateral:  Mild chondrosis.

JOINT: Small joint effusion. Ossified joint body along the
superolateral joint space measuring 2.5 cm (coronal T1 image 20).

POPLITEAL FOSSA: Miniscule Baker's cyst.

EXTENSOR MECHANISM: Intact quadriceps tendon. Intact patellar
tendon.

BONES: No aggressive osseous lesion. No fracture or dislocation.
Tricompartment osteophyte formation.

Other: There is ganglion cyst formation along the proximal
tibiofibular joint, measuring up to 3.5 x 1.1 cm in the axial
dimension and 4.3 cm in craniocaudal extent (axial T2 image 24,
sagittal T2 image 22). There also ganglion cyst forming along the
proximal gastrocnemius tendon insertions on the femur, and along the
posterior aspect of the ACL and PCL. Partially visualized feathery
edema within the gastrocnemius muscles.
IMPRESSION: Tricompartment osteoarthritis of the left knee, worst in the
patellofemoral in the medial compartments, cartilaginous
abnormalities as described above. Small joint effusion with 2.5 cm
ossified joint body along the superolateral joint space.

Intrasubstance degenerative signal within the medial and lateral
menisci, without definitive tear.

Ganglion cyst formation along the proximal tibiofibular joint,
proximal gastrocnemius tendons, and posterior aspect of the ACL and
PCL.

Partially visualized feathery edema within the gastrocnemius
muscles, compatible with low-grade muscle strain.

## 2021-01-28 NOTE — Progress Notes (Signed)
Office Visit Note  Patient: Sheri Carrillo             Date of Birth: [birth year]           MRN: [id number]             PCP: Denny Levy, PA Referring: Rory Percy, MD Visit Date: 01/29/2021  Subjective:  New Patient (Initial Visit) (Total body joint pain, patient has not had COVID vaccines)   History of Present Illness: Sheri Carrillo is a 45 y.o. female here for evaluation of multiple joint pains ongoing for several years. She has had some degree of back problems longstanding, with scoliosis since teenage years. Problems have been substantially increased in the past 2 years since around March 2020 started seeing doctors for the problems with increased pain in back and in knees bilaterally.  Does not have history of specific joint injuries or any joint surgery in the affected areas.  Joint pains are present throughout the day not associated with a specific activity posture or timing.  Have morning stiffness lasting about an hour then improves during the day although can become stiff again when in a stationary position.  She takes ibuprofen 800 mg intermittently as needed which is partially helpful or other NSAIDs.  When she is required prednisone treatments these are beneficial.  She is a patient with [facility] who have seen her for bilateral knee symptoms with previous intra-articular steroid injections for osteoarthritis also possible left knee chronic medial meniscal tear and right knee with suspected loose body versus soft tissue calcification.  Laboratory tests checked evaluating for polyarticular joint pains showed negative ANA rheumatoid factor and normal uric acid and sedimentation rate.  Labs reviewed ANA neg RF neg Uric acid 5.4 ESR 13  Imaging reviewed Xrays 12/24/20 Significant scoliosis of lumbar spine, anterior and lateral osteophyte formation at multiple levels, anterior bridging at T9-T10, otherwise no significant syndesmophyte  formation  Activities of Daily Living:  Patient reports morning stiffness for 1 hour.   Patient Reports nocturnal pain.  Difficulty dressing/grooming: Reports Difficulty climbing stairs: Reports Difficulty getting out of chair: Reports Difficulty using hands for taps, buttons, cutlery, and/or writing: Reports  Review of Systems  Constitutional:  Positive for fatigue.  HENT:  Positive for mouth dryness.   Eyes:  Positive for dryness.  Respiratory:  Negative for shortness of breath.   Cardiovascular:  Positive for swelling in legs/feet.  Gastrointestinal:  Positive for constipation.  Endocrine: Negative for excessive thirst.  Genitourinary:  Negative for difficulty urinating.  Musculoskeletal:  Positive for joint pain, joint pain, joint swelling, muscle weakness and morning stiffness.  Skin:  Negative for rash.  Allergic/Immunologic: Negative for susceptible to infections.  Neurological:  Positive for numbness and weakness.  Hematological:  Positive for bruising/bleeding tendency.  Psychiatric/Behavioral:  Positive for sleep disturbance.    PMFS History:  Patient Active Problem List   Diagnosis Date Noted   Osteoarthrosis involving lower leg 09/15/2016   Pain in joint, lower leg 09/15/2016   Other malaise and fatigue 12/03/2015   Headache, new daily persistent ([facility]) 05/21/2014   Morbid obesity ([place]) 05/21/2014   Low back pain 02/27/2013   Sacroiliitis, not elsewhere classified ([place]) 01/25/2013   Other and unspecified disc disorder of thoracic region 11/23/2012   Scoliosis (and kyphoscoliosis), idiopathic 11/23/2012    Past Medical History:  Diagnosis Date   Asthma    Headache    Osteoarthritis     Family History  Problem Relation Age of  Onset   Arthritis Mother    Diabetes Mother    Lung cancer Mother    Arthritis Father    Lung cancer Father    Arthritis Sister    Arthritis Sister    Arthritis Brother    Arthritis Brother    Arthritis Brother    Past Surgical  History:  Procedure Laterality Date   ABLATION     Social History   Social History Narrative   Lives at home with husband.    Has 2 children.    Caffeine use: 2-3 cups per day     There is no immunization history on file for this patient.   Objective: Vital Signs: BP 123/77 (BP Location: Right Arm, Patient Position: Sitting, Cuff Size: Large)   Pulse 83   Resp 16   Ht 5' 8"  (1.727 m)   Wt (!) 343 lb (155.6 kg)   BMI 52.15 kg/m    Physical Exam Constitutional:      Appearance: She is obese.  Cardiovascular:     Rate and Rhythm: Normal rate and regular rhythm.  Pulmonary:     Effort: Pulmonary effort is normal.     Breath sounds: Normal breath sounds.  Musculoskeletal:     Right lower leg: No edema.     Left lower leg: No edema.  Skin:    General: Skin is warm and dry.     Findings: No rash.  Neurological:     Mental Status: She is alert.  Psychiatric:        Mood and Affect: Mood normal.      Musculoskeletal Exam:  Shoulders full ROM no tenderness or swelling Elbows full ROM no tenderness or swelling Wrists full ROM no tenderness or swelling Fingers full ROM no tenderness or swelling Tenderness to pressure in the upper back above and along medial border of scapula I, there is radiation of pain along the back with pressure over tender points Lumbar paraspinal muscle tenderness to pressure bilaterally Some lateral soreness or stretch provoked with hip rotation Knees full ROM patellofemoral crepitus present mild joint line tenderness to pressure worse along medial border Ankles full ROM no tenderness or swelling   Investigation: No additional findings.  Imaging: MR KNEE RIGHT WO CONTRAST  Result Date: 01/13/2021 CLINICAL DATA:  Right knee pain EXAM: MRI OF THE RIGHT KNEE WITHOUT CONTRAST TECHNIQUE: Multiplanar, multisequence MR imaging of the knee was performed. No intravenous contrast was administered. COMPARISON:  None. FINDINGS: MENISCI Medial:  Degenerative tearing of the posterior horn and body of the medial meniscus, with 6 mm medial extrusion and likely radial tear in the posterior horn (coronal T2 image 15) and substance loss near the meniscal root. Lateral: Intrasubstance degenerative signal without definitive lateral meniscus tear. LIGAMENTS Cruciates: ACL and PCL are intact. Collaterals: Bowing of the MCL from extruded meniscus. The MCL is intact. Lateral collateral ligament complex is intact. CARTILAGE Patellofemoral: Near full-thickness cartilage loss along the lateral patellar facet and lateral trochlea with some subchondral bony edema. Medial: Severe chondrosis with near full-thickness cartilage loss along the weight-bearing surfaces. Lateral:  Mild chondrosis. JOINT: Small joint effusion. There are multiple intra-articular osteochondral joint bodies along the superolateral suprapatellar joint space (axial T2 image 1). POPLITEAL FOSSA: No Baker's cyst. EXTENSOR MECHANISM: Intact quadriceps tendon. Intact patellar tendon. BONES: No aggressive osseous lesion. No fracture or dislocation. Tricompartment osteophyte formation. Other: Ganglion cyst formation along the posterosuperior lateral joint capsule and lateral gastrocnemius tendon measuring 3.5 x 2.3 by 3.3 cm (axial T2  image 7, sagittal image 18). Smaller ganglion cyst formation along the medial gastrocnemius insertion and along the posterior aspect of the ACL and PCL. IMPRESSION: Tricompartment osteoarthritis of the right knee, worst in the medial and patellofemoral compartments, cartilaginous abnormalities as described above. Multiple intra-articular osteochondral joint bodies along the superolateral joint. Degenerative tearing of the posterior horn and body of the medial meniscus with 6 mm medial extrusion, with likely radial tear in the posterior horn and substance loss near the meniscal root. Prominent multifocal ganglion cyst formation as described above. Electronically Signed   By: Maurine Simmering M.D.   On: 01/13/2021 15:33   MR KNEE LEFT WO CONTRAST  Result Date: 01/13/2021 CLINICAL DATA:  Left knee pain EXAM: MRI OF THE LEFT KNEE WITHOUT CONTRAST TECHNIQUE: Multiplanar, multisequence MR imaging of the knee was performed. No intravenous contrast was administered. COMPARISON:  None. FINDINGS: MENISCI Medial: Extensive intrasubstance degenerative signal within the medial meniscus without definitive tear. Lateral: Extensive intrasubstance degenerative signal within the lateral meniscus without definitive tear. LIGAMENTS Cruciates: ACL and PCL are intact. Collaterals: Medial collateral ligament is intact. Lateral collateral ligament complex is intact. CARTILAGE Patellofemoral: Near full-thickness cartilage loss along the lateral patellar facet and lateral trochlea with some underlying bony edema. Medial: Moderate chondrosis with areas of intermediate grade partial-thickness chondral fissuring. Lateral:  Mild chondrosis. JOINT: Small joint effusion. Ossified joint body along the superolateral joint space measuring 2.5 cm (coronal T1 image 20). POPLITEAL FOSSA: Miniscule Baker's cyst. EXTENSOR MECHANISM: Intact quadriceps tendon. Intact patellar tendon. BONES: No aggressive osseous lesion. No fracture or dislocation. Tricompartment osteophyte formation. Other: There is ganglion cyst formation along the proximal tibiofibular joint, measuring up to 3.5 x 1.1 cm in the axial dimension and 4.3 cm in craniocaudal extent (axial T2 image 24, sagittal T2 image 22). There also ganglion cyst forming along the proximal gastrocnemius tendon insertions on the femur, and along the posterior aspect of the ACL and PCL. Partially visualized feathery edema within the gastrocnemius muscles. IMPRESSION: Tricompartment osteoarthritis of the left knee, worst in the patellofemoral in the medial compartments, cartilaginous abnormalities as described above. Small joint effusion with 2.5 cm ossified joint body along the  superolateral joint space. Intrasubstance degenerative signal within the medial and lateral menisci, without definitive tear. Ganglion cyst formation along the proximal tibiofibular joint, proximal gastrocnemius tendons, and posterior aspect of the ACL and PCL. Partially visualized feathery edema within the gastrocnemius muscles, compatible with low-grade muscle strain. Electronically Signed   By: Maurine Simmering M.D.   On: 01/13/2021 15:23    Recent Labs: Lab Results  Component Value Date   NA 141 05/21/2014   K 4.4 05/21/2014   CL 105 05/21/2014   CO2 21 05/21/2014   GLUCOSE 101 (H) 05/21/2014   BUN 19 05/21/2014   CREATININE 0.74 05/21/2014   BILITOT 0.3 05/21/2014   ALKPHOS 71 05/21/2014   AST 20 05/21/2014   ALT 28 05/21/2014   PROT 6.4 05/21/2014   ALBUMIN 4.1 05/21/2014   CALCIUM 9.5 05/21/2014   GFRAA 117 05/21/2014    Speciality Comments: No specialty comments available.  Procedures:  No procedures performed Allergies: Patient has no known allergies.   Assessment / Plan:     Visit Diagnoses: Sacroiliitis, not elsewhere classified ([place]) Midline low back pain without sciatica, unspecified chronicity  Chronic back and posterior pelvic pain appears consistent with degenerative arthritis of the joints.  She also has some osteoarthritis affecting bilateral knees.  I do not see any synovitis or other objective  signs of active inflammation on exam today.  Combined with negative laboratory testing I do not think this is a significant concern.  She is already established with orthopedic group so can follow-up if needing additional injections or advanced therapy.  With the multiple levels of paraspinal muscle tenderness and some radiation of symptoms with tender spots also suggestive for some degree of myofascial pain syndrome.  Recommending her to review [place] patient centered guide for fibromyalgia and chronic pain management  Orders: No orders of the defined types  were placed in this encounter.  No orders of the defined types were placed in this encounter.    Follow-Up Instructions: No follow-ups on file.   Collier Salina, MD  Note - This record has been created using [employer].  Chart creation errors have been sought, but may not always  have been located. Such creation errors do not reflect on  the standard of medical care.

## 2021-01-29 ENCOUNTER — Ambulatory Visit: Payer: BC Managed Care – PPO | Admitting: Internal Medicine

## 2021-01-29 ENCOUNTER — Encounter: Payer: Self-pay | Admitting: Internal Medicine

## 2021-01-29 ENCOUNTER — Other Ambulatory Visit: Payer: Self-pay

## 2021-01-29 VITALS — BP 123/77 | HR 83 | Resp 16 | Ht 68.0 in | Wt 343.0 lb

## 2021-01-29 DIAGNOSIS — M461 Sacroiliitis, not elsewhere classified: Secondary | ICD-10-CM

## 2021-01-29 DIAGNOSIS — M545 Low back pain, unspecified: Secondary | ICD-10-CM | POA: Diagnosis not present

## 2021-01-29 NOTE — Patient Instructions (Signed)
I do not see any particular signs of an ongoing inflammatory or autoimmune disorder as a cause of your joint and body pains at this time. You do have significant osteoarthritis in the spine, hip, and knee joints.  I recommend checking out the [facility] patient-centered guide for fibromyalgia and chronic pain management: [email or web address]

## 2021-03-26 ENCOUNTER — Ambulatory Visit: Payer: BC Managed Care – PPO | Admitting: Orthopaedic Surgery

## 2021-03-26 ENCOUNTER — Other Ambulatory Visit: Payer: Self-pay

## 2021-05-21 ENCOUNTER — Other Ambulatory Visit: Payer: Self-pay | Admitting: Obstetrics and Gynecology

## 2021-05-21 DIAGNOSIS — Z1231 Encounter for screening mammogram for malignant neoplasm of breast: Secondary | ICD-10-CM

## 2021-05-30 ENCOUNTER — Ambulatory Visit: Payer: BC Managed Care – PPO

## 2022-01-12 ENCOUNTER — Ambulatory Visit: Payer: BC Managed Care – PPO | Admitting: Cardiology

## 2022-09-22 ENCOUNTER — Encounter: Payer: Self-pay | Admitting: *Deleted

## 2023-03-18 ENCOUNTER — Encounter: Payer: Self-pay | Admitting: *Deleted

## 2023-09-29 ENCOUNTER — Ambulatory Visit: Admitting: Podiatry

## 2023-09-29 ENCOUNTER — Ambulatory Visit (INDEPENDENT_AMBULATORY_CARE_PROVIDER_SITE_OTHER)

## 2023-09-29 ENCOUNTER — Encounter: Payer: Self-pay | Admitting: Podiatry

## 2023-09-29 DIAGNOSIS — M21612 Bunion of left foot: Secondary | ICD-10-CM

## 2023-09-29 DIAGNOSIS — M779 Enthesopathy, unspecified: Secondary | ICD-10-CM

## 2023-09-29 DIAGNOSIS — M21611 Bunion of right foot: Secondary | ICD-10-CM | POA: Diagnosis not present

## 2023-09-29 MED ORDER — TRIAMCINOLONE ACETONIDE 10 MG/ML IJ SUSP
10.0000 mg | Freq: Once | INTRAMUSCULAR | Status: AC
Start: 2023-09-29 — End: 2023-09-29
  Administered 2023-09-29: 10 mg via INTRA_ARTICULAR

## 2023-09-30 NOTE — Progress Notes (Signed)
 Subjective:   Patient ID: Sheri Carrillo, female   DOB: 48 y.o.   MRN: [id number]   HPI Patient presents with husband with severe bunion deformity left over right that is gotten worse over the years significant family history and a lot of pain on top of the foot left over right with gait.  Also has severe knee problems due for surgery but needs to lose weight before she will be eligible.  Patient does not smoke likes to be active   Review of Systems  All other systems reviewed and are negative.       Objective:  Physical Exam Vitals and nursing note reviewed.  Constitutional:      Appearance: She is well-developed.  Pulmonary:     Effort: Pulmonary effort is normal.   Musculoskeletal:        General: Normal range of motion.   Skin:    General: Skin is warm.   Neurological:     Mental Status: She is alert.     Neurovascular status found to be intact muscle strength was found to be adequate range of motion adequate extremely large deformity left over right first metatarsal head with redness and pain around the area.  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  At this point I did go ahead and I am going to try to work on the dorsal foot structure and the midtarsal joint see what kind of relief we can get and I did discuss this will most likely take a Lapidus fusion left but also we may need to evaluate the midtarsal joint for fusion.  Today I went ahead did sterile prep I injected the entire extensor complex 3 mg dexamethasone Kenalog 5 mg Xylocaine applied sterile  dressing reappoint to recheck  X-rays indicate that there is severe bunion left over right approximate 18 to 19 degrees left 16 degrees on the right deviated hallux and signs of subtle arthritis around the midtarsal joint left over right

## 2023-10-27 ENCOUNTER — Ambulatory Visit: Admitting: Podiatry

## 2024-02-03 ENCOUNTER — Encounter: Payer: Self-pay | Admitting: Podiatry

## 2024-02-03 ENCOUNTER — Ambulatory Visit: Admitting: Podiatry

## 2024-02-03 DIAGNOSIS — M21611 Bunion of right foot: Secondary | ICD-10-CM

## 2024-02-03 DIAGNOSIS — M21612 Bunion of left foot: Secondary | ICD-10-CM | POA: Diagnosis not present

## 2024-02-03 DIAGNOSIS — M779 Enthesopathy, unspecified: Secondary | ICD-10-CM

## 2024-02-03 MED ORDER — TRIAMCINOLONE ACETONIDE 10 MG/ML IJ SUSP
10.0000 mg | Freq: Once | INTRAMUSCULAR | Status: AC
  Administered 2024-02-03: 10 mg via INTRA_ARTICULAR

## 2024-02-04 NOTE — Progress Notes (Signed)
 Subjective:   Patient ID: Sheri Carrillo, female   DOB: 48 y.o.   MRN: [id number]   HPI Patient states she had around 3 months relief from the pain on top of both feet and she knows that she needs to get these severe bunions fixed and also the top of the feet where she gets chronic pain.  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  Education rendered concerning this and she wants to get these bunions fixed but will need to wait till next year and is most likely gena have her knee surgery towards the beginning of next year and hopefully the bunion surgery towards the end of the year.  I did educate her on bunions and at this point I did do sterile prep reinjected the dorsal complex bilateral 3 mg dexamethasone Kenalog  5 mg Xylocaine and reappoint to recheck
